# Patient Record
Sex: Female | Born: 1950 | Race: Black or African American | Hispanic: No | Marital: Married | Smoking: Never smoker
Health system: Southern US, Community
[De-identification: ages and names within clinical notes are randomized; demographics above are authoritative.]

---

## 2006-08-27 ENCOUNTER — Other Ambulatory Visit: Payer: Self-pay

## 2006-08-27 ENCOUNTER — Emergency Department: Payer: Self-pay | Admitting: Emergency Medicine

## 2008-11-09 ENCOUNTER — Ambulatory Visit: Payer: Self-pay | Admitting: Internal Medicine

## 2015-05-20 ENCOUNTER — Encounter: Payer: Self-pay | Admitting: Emergency Medicine

## 2015-05-20 ENCOUNTER — Ambulatory Visit
Admission: EM | Admit: 2015-05-20 | Discharge: 2015-05-20 | Disposition: A | Discharge status: Home / Self Care | Payer: 59 | Attending: Family Medicine | Admitting: Family Medicine

## 2015-05-20 DIAGNOSIS — Z23 Encounter for immunization: Secondary | ICD-10-CM

## 2015-05-20 DIAGNOSIS — L089 Local infection of the skin and subcutaneous tissue, unspecified: Secondary | ICD-10-CM | POA: Diagnosis not present

## 2015-05-20 DIAGNOSIS — S60949A Unspecified superficial injury of unspecified finger, initial encounter: Secondary | ICD-10-CM | POA: Diagnosis not present

## 2015-05-20 DIAGNOSIS — Z283 Underimmunization status: Secondary | ICD-10-CM

## 2015-05-20 DIAGNOSIS — Z2839 Other underimmunization status: Secondary | ICD-10-CM

## 2015-05-20 MED ORDER — SULFAMETHOXAZOLE-TRIMETHOPRIM 800-160 MG PO TABS: 1.0000 | ORAL_TABLET | Freq: Two times a day (BID) | ORAL | Status: AC

## 2015-05-20 MED ORDER — TETANUS-DIPHTH-ACELL PERTUSSIS 5-2.5-18.5 LF-MCG/0.5 IM SUSP
0.5000 mL | Freq: Once | INTRAMUSCULAR | Status: AC
  Administered 2015-05-20: 0.5 mL via INTRAMUSCULAR

## 2015-05-20 MED ORDER — HYDROCODONE-ACETAMINOPHEN 5-325 MG PO TABS: 1.0000 | ORAL_TABLET | Freq: Three times a day (TID) | ORAL | Status: AC | PRN

## 2015-05-20 MED ORDER — MUPIROCIN 2 % EX OINT: 1.0000 "application " | TOPICAL_OINTMENT | Freq: Three times a day (TID) | CUTANEOUS | Status: AC

## 2015-05-20 NOTE — ED Notes (Signed)
Patient c/o jamming her left 3rd finger 3 weeks ago. Patient reports swelling and pain in her left 3rd finger on Sunday.  Patient denies fevers.

## 2015-05-20 NOTE — Discharge Instructions (Signed)
Fingertip Infection °When an infection is around the nail, it is called a paronychia. When it appears over the tip of the finger, it is called a felon. These infections are due to minor injuries or cracks in the skin. If they are not treated properly, they can lead to bone infection and permanent damage to the fingernail. °Incision and drainage is necessary if a pus pocket (an abscess) has formed. Antibiotics and pain medicine may also be needed. Keep your hand elevated for the next 2-3 days to reduce swelling and pain. If a pack was placed in the abscess, it should be removed in 1-2 days by your caregiver. Soak the finger in warm water for 20 minutes 4 times daily to help promote drainage. °Keep the hands as dry as possible. Wear protective gloves with cotton liners. See your caregiver for follow-up care as recommended.  °HOME CARE INSTRUCTIONS  °· Keep wound clean, dry and dressed as suggested by your caregiver. °· Soak in warm salt water for fifteen minutes, four times per day for bacterial infections. °· Your caregiver will prescribe an antibiotic if a bacterial infection is suspected. Take antibiotics as directed and finish the prescription, even if the problem appears to be improving before the medicine is gone. °· Only take over-the-counter or prescription medicines for pain, discomfort, or fever as directed by your caregiver. °SEEK IMMEDIATE MEDICAL CARE IF: °· There is redness, swelling, or increasing pain in the wound. °· Pus or any other unusual drainage is coming from the wound. °· An unexplained oral temperature above 102° F (38.9° C) develops. °· You notice a foul smell coming from the wound or dressing. °MAKE SURE YOU:  °· Understand these instructions. °· Monitor your condition. °· Contact your caregiver if you are getting worse or not improving. °  °This information is not intended to replace advice given to you by your health care provider. Make sure you discuss any questions you have with your  health care provider. °  °Document Released: 06/15/2004 Document Revised: 07/31/2011 Document Reviewed: 10/26/2014 °Elsevier Interactive Patient Education ©2016 [employer] ° °Cellulitis °Cellulitis is an infection of the skin and the tissue under the skin. The infected area is usually red and tender. This happens most often in the arms and lower legs. °HOME CARE  °· Take your antibiotic medicine as told. Finish the medicine even if you start to feel better. °· Keep the infected arm or leg raised (elevated). °· Put a warm cloth on the area up to 4 times per day. °· Only take medicines as told by your doctor. °· Keep all doctor visits as told. °GET HELP IF: °· You see red streaks on the skin coming from the infected area. °· Your red area gets bigger or turns a dark color. °· Your bone or joint under the infected area is painful after the skin heals. °· Your infection comes back in the same area or different area. °· You have a puffy (swollen) bump in the infected area. °· You have new symptoms. °· You have a fever. °GET HELP RIGHT AWAY IF:  °· You feel very sleepy. °· You throw up (vomit) or have watery poop (diarrhea). °· You feel sick and have muscle aches and pains. °  °This information is not intended to replace advice given to you by your health care provider. Make sure you discuss any questions you have with your health care provider. °  °Document Released: 10/25/2007 Document Revised: 01/27/2015 Document Reviewed: 07/24/2011 °Elsevier Interactive Patient   Education ©2016 [employer] ° °
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
## 2016-01-10 ENCOUNTER — Ambulatory Visit
Admission: EM | Admit: 2016-01-10 | Discharge: 2016-01-10 | Disposition: A | Discharge status: Home / Self Care | Payer: 59 | Attending: Family Medicine | Admitting: Family Medicine

## 2016-01-10 ENCOUNTER — Encounter: Payer: Self-pay | Admitting: *Deleted

## 2016-01-10 DIAGNOSIS — M25562 Pain in left knee: Secondary | ICD-10-CM | POA: Diagnosis not present

## 2016-01-10 DIAGNOSIS — M129 Arthropathy, unspecified: Secondary | ICD-10-CM | POA: Diagnosis not present

## 2016-01-10 DIAGNOSIS — M1712 Unilateral primary osteoarthritis, left knee: Secondary | ICD-10-CM

## 2016-01-10 NOTE — ED Triage Notes (Signed)
Patient started having severe left knee pain three weeks ago. Pain has improved with OTC medications and ace wrap application. No visible bruising or redness. Patient was on a retreat one week before pain started.

## 2016-01-10 NOTE — ED Provider Notes (Signed)
[facility]    CSN: [id number] Arrival date & time: 01/10/16  [id number]  First Provider Contact:  None       History   Chief Complaint Chief Complaint  Patient presents with  . Knee Pain    HPI Jean Coleman is a 65 y.o. female.   The history is provided by the patient.  Patient started having severe left knee pain three weeks ago. Pain has improved with OTC medications and ace wrap application. No visible bruising or redness. Patient was on a retreat one week before pain started. States she did a lot of walking during the retreat, including up and down stairs and symptoms started right after this time. Denies any falls, direct injuries or trauma, fevers, chills, redness, swelling, numbness/tingling.  History reviewed. No pertinent past medical history.  There are no active problems to display for this patient.   History reviewed. No pertinent surgical history.  OB History    No data available       Home Medications    Prior to Admission medications   Medication Sig Start Date End Date Taking? Authorizing Provider  HYDROcodone-acetaminophen (NORCO) 5-325 MG tablet Take 1 tablet by mouth every 8 (eight) hours as needed for moderate pain. 05/20/15   Hassan RowanEugene Wade, MD  mupirocin ointment (BACTROBAN) 2 % Apply 1 application topically 3 (three) times daily. 05/20/15   Hassan RowanEugene Wade, MD  sulfamethoxazole-trimethoprim (BACTRIM DS,SEPTRA DS) 800-160 MG tablet Take 1 tablet by mouth 2 (two) times daily. 05/20/15   Hassan RowanEugene Wade, MD    Family History History reviewed. No pertinent family history.  Social History Social History  Substance Use Topics  . Smoking status: Never Smoker  . Smokeless tobacco: Never Used  . Alcohol use No     Allergies   Review of patient's allergies indicates no known allergies.   Review of Systems Review of Systems   Physical Exam Triage Vital Signs ED Triage Vitals  Enc Vitals Group     BP 01/10/16 0849 (!) 146/74     Pulse Rate  01/10/16 0849 88     Resp 01/10/16 0849 18     Temp 01/10/16 0849 97.7 F (36.5 C)     Temp Source 01/10/16 0849 Oral     SpO2 01/10/16 0849 100 %     Weight 01/10/16 0854 160 lb (72.6 kg)     Height 01/10/16 0854 5' (1.524 m)     Head Circumference --      Peak Flow --      Pain Score 01/10/16 0856 1     Pain Loc --      Pain Edu? --      Excl. in GC? --    No data found.   Updated Vital Signs BP (!) 146/74 (BP Location: Left Arm)   Pulse 88   Temp 97.7 F (36.5 C) (Oral)   Resp 18   Ht 5' (1.524 m)   Wt 160 lb (72.6 kg)   SpO2 100%   BMI 31.25 kg/m   Visual Acuity Right Eye Distance:   Left Eye Distance:   Bilateral Distance:    Right Eye Near:   Left Eye Near:    Bilateral Near:     Physical Exam  Constitutional: She appears well-developed and well-nourished. No distress.  Musculoskeletal:       Left knee: She exhibits normal range of motion, no swelling, no effusion, no ecchymosis, no deformity, no laceration, no erythema, normal alignment, no LCL laxity, normal  patellar mobility, no bony tenderness, normal meniscus and no MCL laxity. Tenderness (mild diffuse tenderness) found.  Mild articular crepitus of knee joint with range of motion  Skin: She is not diaphoretic.  Nursing note and vitals reviewed.    UC Treatments / Results  Labs (all labs ordered are listed, but only abnormal results are displayed) Labs Reviewed - No data to display  EKG  EKG Interpretation None       Radiology No results found.  Procedures Procedures (including critical care time)  Medications Ordered in UC Medications - No data to display   Initial Impression / Assessment and Plan / UC Course  I have reviewed the triage vital signs and the nursing notes.  Pertinent labs & imaging results that were available during my care of the patient were reviewed by me and considered in my medical decision making (see chart for details).  Clinical Course      Final  Clinical Impressions(s) / UC Diagnoses   Final diagnoses:  Arthritis of left knee  Left knee pain    New Prescriptions Discharge Medication List as of 01/10/2016  9:30 AM     1. diagnosis reviewed with patient 2. Recommend supportive treatment with otc acetaminophen, rest, elevation, ice 3. Follow-up prn if symptoms worsen or don't improve   Payton Mccallumrlando Karinda Cabriales, MD 01/10/16 1042

## 2017-07-11 ENCOUNTER — Other Ambulatory Visit: Payer: Self-pay | Admitting: Family Medicine

## 2017-07-11 DIAGNOSIS — Z1231 Encounter for screening mammogram for malignant neoplasm of breast: Secondary | ICD-10-CM

## 2017-07-31 ENCOUNTER — Ambulatory Visit
Admission: RE | Admit: 2017-07-31 | Discharge: 2017-07-31 | Disposition: A | Discharge status: Home / Self Care | Payer: Medicare Other | Source: Ambulatory Visit | Attending: Family Medicine | Admitting: Family Medicine

## 2017-07-31 DIAGNOSIS — Z1231 Encounter for screening mammogram for malignant neoplasm of breast: Secondary | ICD-10-CM | POA: Insufficient documentation

## 2017-08-09 ENCOUNTER — Other Ambulatory Visit: Payer: Self-pay | Admitting: Family Medicine

## 2017-08-09 DIAGNOSIS — N6320 Unspecified lump in the left breast, unspecified quadrant: Secondary | ICD-10-CM

## 2017-08-09 DIAGNOSIS — R928 Other abnormal and inconclusive findings on diagnostic imaging of breast: Secondary | ICD-10-CM

## 2017-08-16 ENCOUNTER — Ambulatory Visit
Admission: RE | Admit: 2017-08-16 | Discharge: 2017-08-16 | Disposition: A | Discharge status: Home / Self Care | Payer: Medicare Other | Source: Ambulatory Visit | Attending: Family Medicine | Admitting: Family Medicine

## 2017-08-16 DIAGNOSIS — N6320 Unspecified lump in the left breast, unspecified quadrant: Secondary | ICD-10-CM

## 2017-08-16 DIAGNOSIS — N6322 Unspecified lump in the left breast, upper inner quadrant: Secondary | ICD-10-CM | POA: Diagnosis not present

## 2017-08-16 DIAGNOSIS — R922 Inconclusive mammogram: Secondary | ICD-10-CM | POA: Diagnosis present

## 2017-08-16 DIAGNOSIS — R928 Other abnormal and inconclusive findings on diagnostic imaging of breast: Secondary | ICD-10-CM

## 2017-08-20 ENCOUNTER — Other Ambulatory Visit: Payer: Self-pay | Admitting: Family Medicine

## 2017-08-20 DIAGNOSIS — R928 Other abnormal and inconclusive findings on diagnostic imaging of breast: Secondary | ICD-10-CM

## 2017-08-20 DIAGNOSIS — N6320 Unspecified lump in the left breast, unspecified quadrant: Secondary | ICD-10-CM

## 2017-08-23 ENCOUNTER — Ambulatory Visit
Admission: RE | Admit: 2017-08-23 | Discharge: 2017-08-23 | Disposition: A | Discharge status: Home / Self Care | Payer: Medicare Other | Source: Ambulatory Visit | Attending: Family Medicine | Admitting: Family Medicine

## 2017-08-23 DIAGNOSIS — N641 Fat necrosis of breast: Secondary | ICD-10-CM | POA: Insufficient documentation

## 2017-08-23 DIAGNOSIS — N6322 Unspecified lump in the left breast, upper inner quadrant: Secondary | ICD-10-CM | POA: Diagnosis present

## 2017-08-23 DIAGNOSIS — N6320 Unspecified lump in the left breast, unspecified quadrant: Secondary | ICD-10-CM

## 2017-08-23 DIAGNOSIS — R928 Other abnormal and inconclusive findings on diagnostic imaging of breast: Secondary | ICD-10-CM

## 2017-08-23 DIAGNOSIS — R922 Inconclusive mammogram: Secondary | ICD-10-CM | POA: Diagnosis present

## 2017-08-23 HISTORY — PX: BREAST BIOPSY: SHX20

## 2017-08-24 LAB — SURGICAL PATHOLOGY

## 2018-02-06 ENCOUNTER — Other Ambulatory Visit: Payer: Self-pay | Admitting: Family Medicine

## 2018-02-06 DIAGNOSIS — R928 Other abnormal and inconclusive findings on diagnostic imaging of breast: Secondary | ICD-10-CM

## 2018-02-11 ENCOUNTER — Other Ambulatory Visit: Payer: Self-pay | Admitting: Family Medicine

## 2018-02-11 ENCOUNTER — Other Ambulatory Visit: Payer: Self-pay | Admitting: Hematology and Oncology

## 2018-02-11 DIAGNOSIS — R928 Other abnormal and inconclusive findings on diagnostic imaging of breast: Secondary | ICD-10-CM

## 2018-03-21 ENCOUNTER — Ambulatory Visit
Admission: RE | Admit: 2018-03-21 | Discharge: 2018-03-21 | Disposition: A | Discharge status: Home / Self Care | Payer: Medicare Other | Source: Ambulatory Visit | Attending: Family Medicine | Admitting: Family Medicine

## 2018-03-21 DIAGNOSIS — R928 Other abnormal and inconclusive findings on diagnostic imaging of breast: Secondary | ICD-10-CM | POA: Diagnosis not present

## 2018-06-18 ENCOUNTER — Other Ambulatory Visit: Payer: Self-pay | Admitting: Family Medicine

## 2018-06-18 DIAGNOSIS — Z780 Asymptomatic menopausal state: Secondary | ICD-10-CM

## 2018-06-18 DIAGNOSIS — Z1231 Encounter for screening mammogram for malignant neoplasm of breast: Secondary | ICD-10-CM

## 2018-08-29 ENCOUNTER — Other Ambulatory Visit: Payer: Medicare Other

## 2018-10-24 ENCOUNTER — Ambulatory Visit
Admission: RE | Admit: 2018-10-24 | Discharge: 2018-10-24 | Disposition: A | Discharge status: Home / Self Care | Payer: Medicare Other | Source: Ambulatory Visit | Attending: Family Medicine | Admitting: Family Medicine

## 2018-10-24 ENCOUNTER — Other Ambulatory Visit: Payer: Self-pay

## 2018-10-24 DIAGNOSIS — Z780 Asymptomatic menopausal state: Secondary | ICD-10-CM

## 2018-10-24 DIAGNOSIS — Z1231 Encounter for screening mammogram for malignant neoplasm of breast: Secondary | ICD-10-CM | POA: Diagnosis present

## 2019-06-23 ENCOUNTER — Other Ambulatory Visit: Payer: Self-pay | Admitting: Hematology and Oncology

## 2019-06-23 DIAGNOSIS — Z1231 Encounter for screening mammogram for malignant neoplasm of breast: Secondary | ICD-10-CM

## 2019-10-28 ENCOUNTER — Ambulatory Visit
Admission: RE | Admit: 2019-10-28 | Discharge: 2019-10-28 | Disposition: A | Discharge status: Home / Self Care | Payer: Medicare Other | Source: Ambulatory Visit | Attending: Hematology and Oncology | Admitting: Hematology and Oncology

## 2019-10-28 DIAGNOSIS — Z1231 Encounter for screening mammogram for malignant neoplasm of breast: Secondary | ICD-10-CM | POA: Diagnosis present

## 2020-10-11 ENCOUNTER — Other Ambulatory Visit: Payer: Self-pay | Admitting: Family Medicine

## 2020-10-11 DIAGNOSIS — Z1231 Encounter for screening mammogram for malignant neoplasm of breast: Secondary | ICD-10-CM

## 2020-10-28 ENCOUNTER — Other Ambulatory Visit: Payer: Self-pay

## 2020-10-28 ENCOUNTER — Ambulatory Visit
Admission: RE | Admit: 2020-10-28 | Discharge: 2020-10-28 | Disposition: A | Discharge status: Home / Self Care | Payer: Medicare Other | Source: Ambulatory Visit | Attending: Family Medicine | Admitting: Family Medicine

## 2020-10-28 DIAGNOSIS — Z1231 Encounter for screening mammogram for malignant neoplasm of breast: Secondary | ICD-10-CM | POA: Diagnosis present

## 2021-06-28 ENCOUNTER — Other Ambulatory Visit: Payer: Self-pay | Admitting: Family Medicine

## 2021-06-28 DIAGNOSIS — Z1231 Encounter for screening mammogram for malignant neoplasm of breast: Secondary | ICD-10-CM

## 2021-07-22 ENCOUNTER — Other Ambulatory Visit: Payer: Self-pay | Admitting: Endocrinology

## 2021-07-22 DIAGNOSIS — M8589 Other specified disorders of bone density and structure, multiple sites: Secondary | ICD-10-CM

## 2021-07-22 DIAGNOSIS — E349 Endocrine disorder, unspecified: Secondary | ICD-10-CM

## 2021-07-22 DIAGNOSIS — E559 Vitamin D deficiency, unspecified: Secondary | ICD-10-CM

## 2021-10-31 ENCOUNTER — Ambulatory Visit
Admission: RE | Admit: 2021-10-31 | Discharge: 2021-10-31 | Disposition: A | Discharge status: Home / Self Care | Payer: Medicare Other | Source: Ambulatory Visit | Attending: Endocrinology | Admitting: Endocrinology

## 2021-10-31 ENCOUNTER — Ambulatory Visit
Admission: RE | Admit: 2021-10-31 | Discharge: 2021-10-31 | Disposition: A | Discharge status: Home / Self Care | Payer: Medicare Other | Source: Ambulatory Visit | Attending: Family Medicine | Admitting: Family Medicine

## 2021-10-31 DIAGNOSIS — Z1231 Encounter for screening mammogram for malignant neoplasm of breast: Secondary | ICD-10-CM | POA: Diagnosis present

## 2021-10-31 DIAGNOSIS — E349 Endocrine disorder, unspecified: Secondary | ICD-10-CM | POA: Insufficient documentation

## 2021-10-31 DIAGNOSIS — E559 Vitamin D deficiency, unspecified: Secondary | ICD-10-CM

## 2021-10-31 DIAGNOSIS — M8589 Other specified disorders of bone density and structure, multiple sites: Secondary | ICD-10-CM

## 2022-07-11 ENCOUNTER — Other Ambulatory Visit: Payer: Self-pay | Admitting: Family Medicine

## 2022-07-11 DIAGNOSIS — Z1231 Encounter for screening mammogram for malignant neoplasm of breast: Secondary | ICD-10-CM

## 2022-11-02 ENCOUNTER — Ambulatory Visit
Admission: RE | Admit: 2022-11-02 | Discharge: 2022-11-02 | Disposition: A | Discharge status: Home / Self Care | Payer: Medicare Other | Source: Ambulatory Visit | Attending: Family Medicine | Admitting: Family Medicine

## 2022-11-02 DIAGNOSIS — Z1231 Encounter for screening mammogram for malignant neoplasm of breast: Secondary | ICD-10-CM | POA: Insufficient documentation

## 2023-07-16 ENCOUNTER — Other Ambulatory Visit: Payer: Self-pay | Admitting: Family Medicine

## 2023-07-16 DIAGNOSIS — Z1231 Encounter for screening mammogram for malignant neoplasm of breast: Secondary | ICD-10-CM

## 2023-09-20 IMAGING — MG MM DIGITAL SCREENING BILAT W/ TOMO AND CAD
6 of 10 series · 6 of 30 positions shown · non-contrast
Comparison: Previous exam(s).

CLINICAL DATA: Screening.

EXAM:
DIGITAL SCREENING BILATERAL MAMMOGRAM WITH TOMOSYNTHESIS AND CAD
TECHNIQUE: Bilateral screening digital craniocaudal and mediolateral oblique
mammograms were obtained. Bilateral screening digital breast
tomosynthesis was performed. The images were evaluated with
computer-aided detection.

[L CC synth-2D]
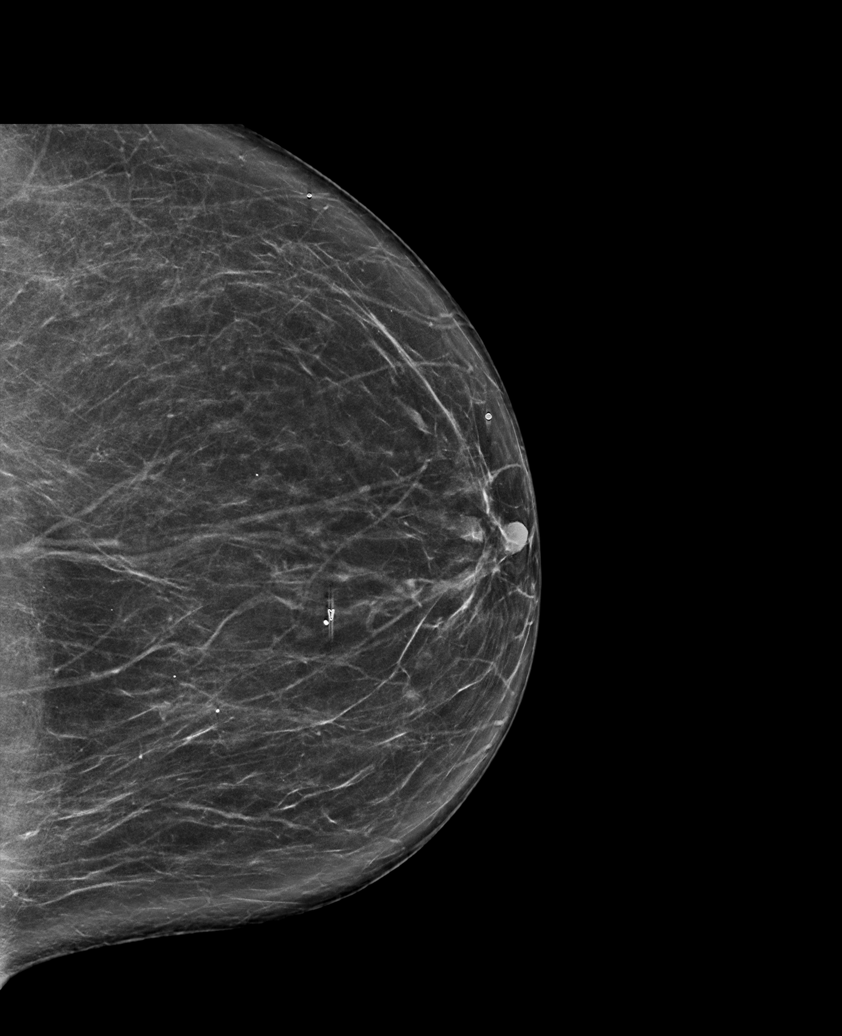

[R MLO synth-2D]
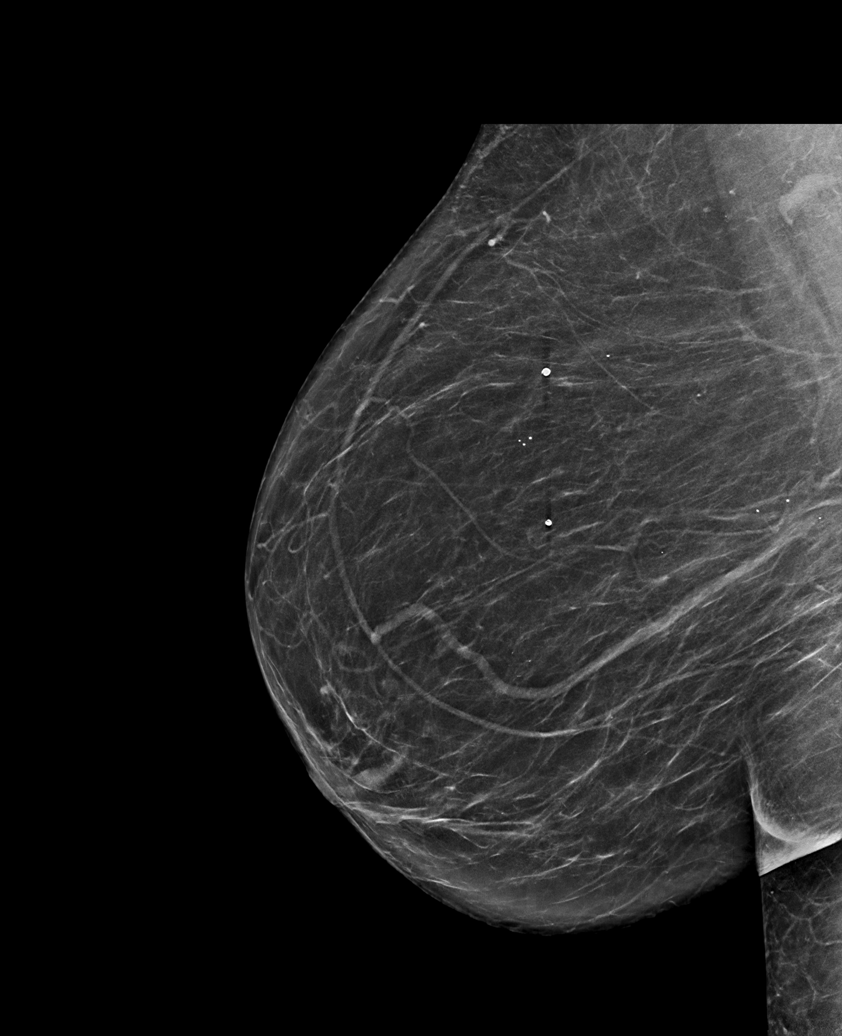

[L MLO synth-2D (1 of 2)]
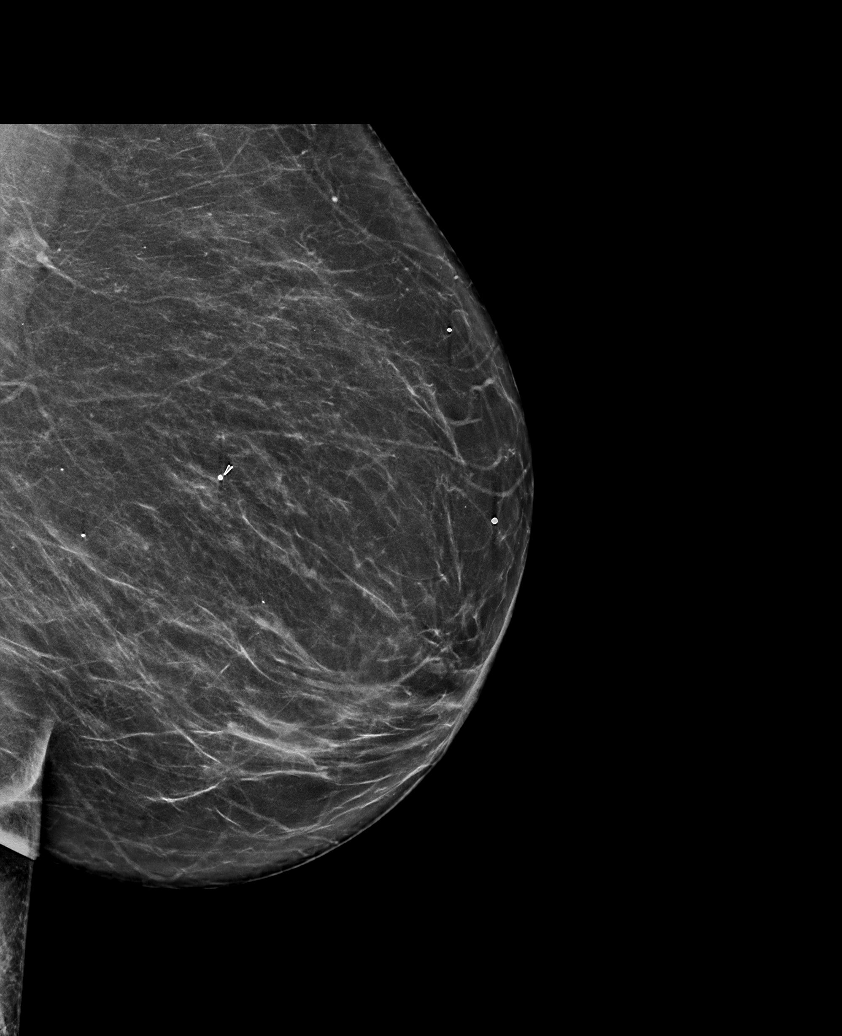

[L MLO synth-2D (2 of 2)]
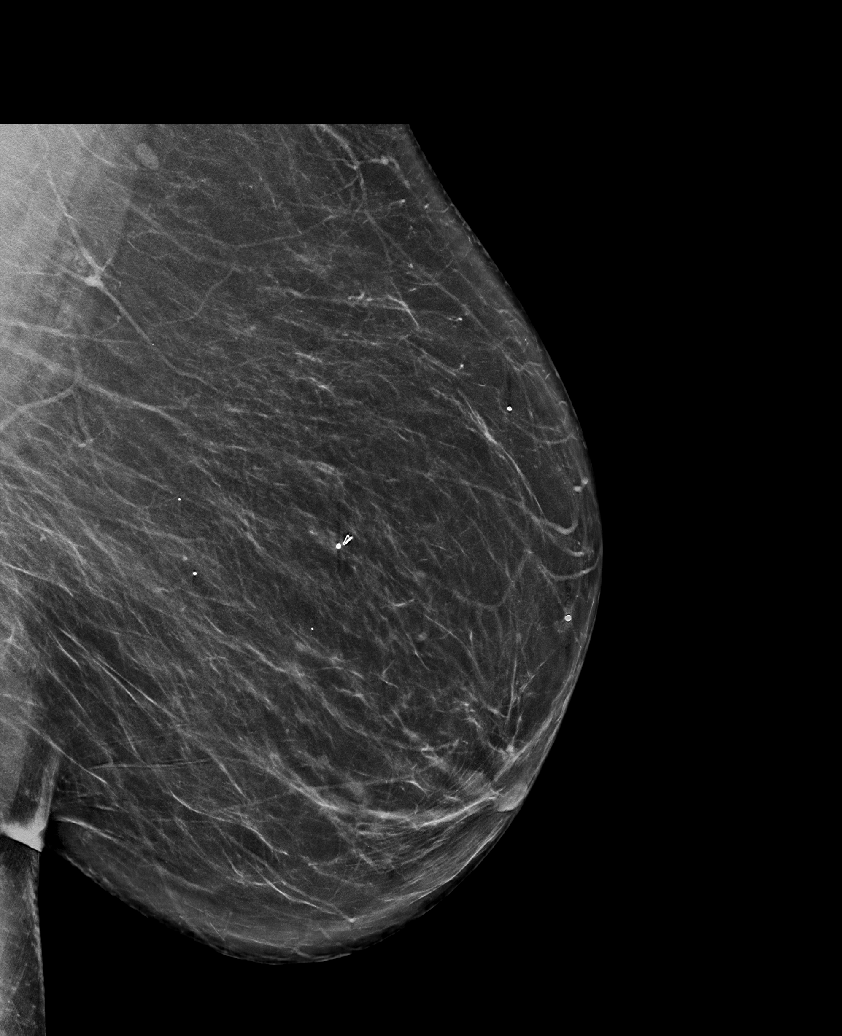

[R CC synth-2D]
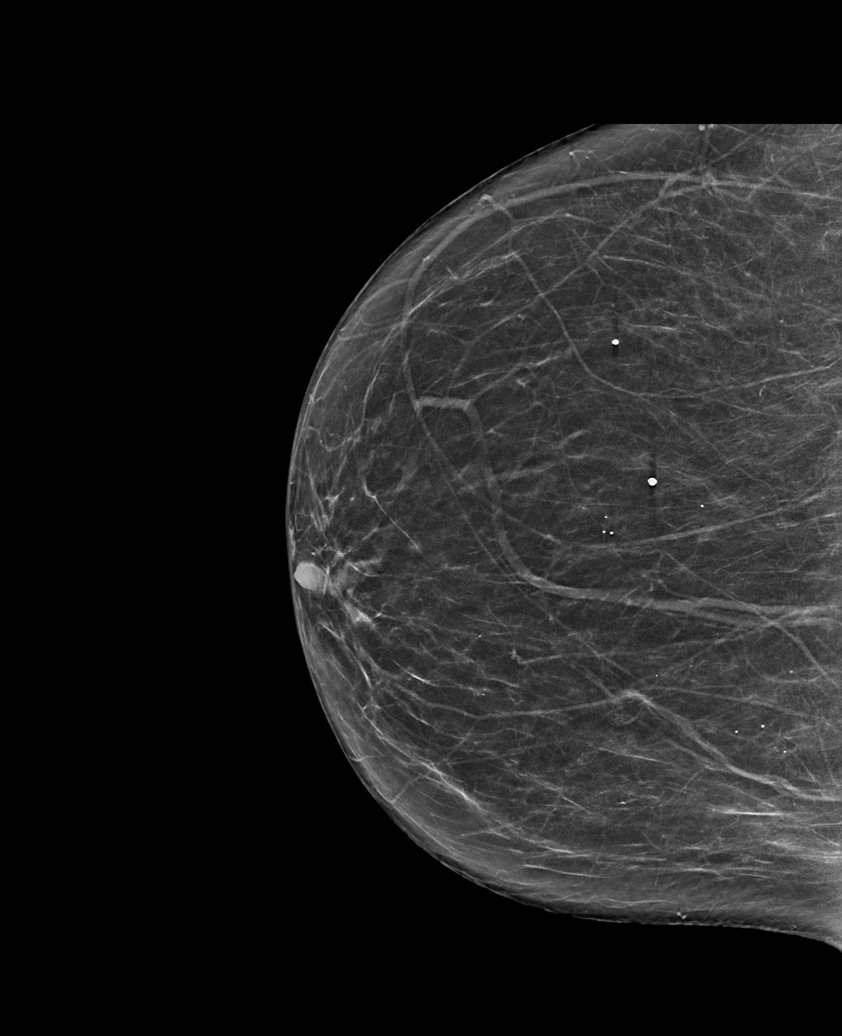

[L MLO tomo · tomo slice 40/79.0]
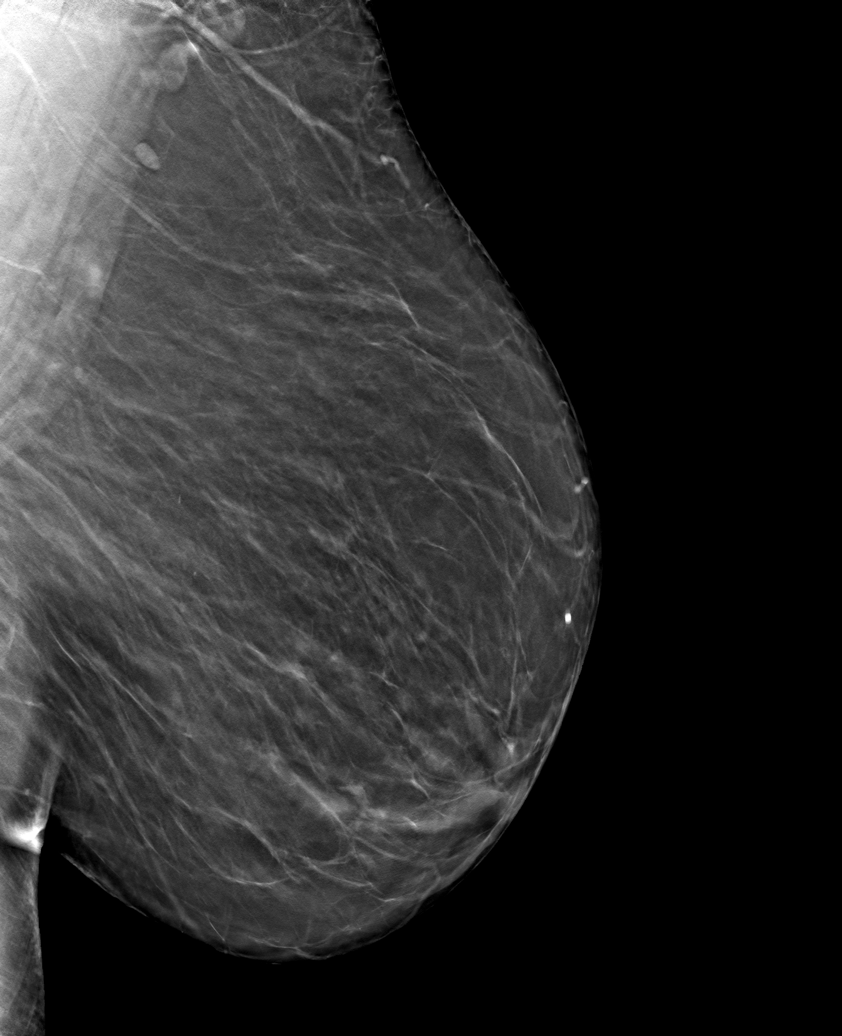

[6 of 30 positions shown; findings below may reference images not displayed]

ACR Breast Density Category b: There are scattered areas of
fibroglandular density.
FINDINGS: There are no findings suspicious for malignancy.
IMPRESSION: No mammographic evidence of malignancy. A result letter of this
screening mammogram will be mailed directly to the patient.

RECOMMENDATION:
Screening mammogram in one year. (Code:51-O-LD2)

BI-RADS CATEGORY  1: Negative.

## 2023-11-05 ENCOUNTER — Ambulatory Visit
Admission: RE | Admit: 2023-11-05 | Discharge: 2023-11-05 | Disposition: A | Discharge status: Home / Self Care | Payer: Medicare Other | Source: Ambulatory Visit | Attending: Family Medicine | Admitting: Family Medicine

## 2023-11-05 DIAGNOSIS — Z1231 Encounter for screening mammogram for malignant neoplasm of breast: Secondary | ICD-10-CM | POA: Diagnosis present
